# Patient Record
Sex: Female | Born: 1975 | Race: White | Hispanic: No | Marital: Married | Smoking: Never smoker
Health system: Southern US, Community
[De-identification: ages and names within clinical notes are randomized; demographics above are authoritative.]

## PROBLEM LIST (undated history)

## (undated) DIAGNOSIS — F319 Bipolar disorder, unspecified: Secondary | ICD-10-CM

## (undated) DIAGNOSIS — E7800 Pure hypercholesterolemia, unspecified: Secondary | ICD-10-CM

## (undated) DIAGNOSIS — F419 Anxiety disorder, unspecified: Secondary | ICD-10-CM

---

## 2016-10-19 ENCOUNTER — Other Ambulatory Visit: Payer: Self-pay | Admitting: Surgical Oncology

## 2016-10-19 DIAGNOSIS — K219 Gastro-esophageal reflux disease without esophagitis: Secondary | ICD-10-CM

## 2016-10-29 ENCOUNTER — Ambulatory Visit
Admission: RE | Admit: 2016-10-29 | Discharge: 2016-10-29 | Disposition: A | Discharge status: Home / Self Care | Payer: Managed Care, Other (non HMO) | Source: Ambulatory Visit | Attending: Surgical Oncology | Admitting: Surgical Oncology

## 2016-10-29 DIAGNOSIS — K219 Gastro-esophageal reflux disease without esophagitis: Secondary | ICD-10-CM

## 2017-04-28 ENCOUNTER — Emergency Department (HOSPITAL_BASED_OUTPATIENT_CLINIC_OR_DEPARTMENT_OTHER)
Admission: EM | Admit: 2017-04-28 | Discharge: 2017-04-29 | Disposition: A | Discharge status: Home / Self Care | Payer: Managed Care, Other (non HMO) | Attending: Emergency Medicine | Admitting: Emergency Medicine

## 2017-04-28 ENCOUNTER — Encounter (HOSPITAL_BASED_OUTPATIENT_CLINIC_OR_DEPARTMENT_OTHER): Payer: Self-pay

## 2017-04-28 ENCOUNTER — Emergency Department (HOSPITAL_BASED_OUTPATIENT_CLINIC_OR_DEPARTMENT_OTHER): Payer: Managed Care, Other (non HMO)

## 2017-04-28 DIAGNOSIS — R109 Unspecified abdominal pain: Secondary | ICD-10-CM | POA: Insufficient documentation

## 2017-04-28 DIAGNOSIS — Z79899 Other long term (current) drug therapy: Secondary | ICD-10-CM | POA: Insufficient documentation

## 2017-04-28 HISTORY — DX: Anxiety disorder, unspecified: F41.9

## 2017-04-28 HISTORY — DX: Pure hypercholesterolemia, unspecified: E78.00

## 2017-04-28 HISTORY — DX: Bipolar disorder, unspecified: F31.9

## 2017-04-28 LAB — COMPREHENSIVE METABOLIC PANEL
ALBUMIN: 4.1 g/dL (ref 3.5–5.0)
ALK PHOS: 67 U/L (ref 38–126)
ALT: 22 U/L (ref 14–54)
ANION GAP: 9 (ref 5–15)
AST: 23 U/L (ref 15–41)
BUN: 26 mg/dL — ABNORMAL HIGH (ref 6–20)
CO2: 24 mmol/L (ref 22–32)
Calcium: 9.7 mg/dL (ref 8.9–10.3)
Chloride: 104 mmol/L (ref 101–111)
Creatinine, Ser: 0.94 mg/dL (ref 0.44–1.00)
GFR calc non Af Amer: >60 mL/min (ref >60)
Glucose, Bld: 117 mg/dL — ABNORMAL HIGH (ref 65–99)
POTASSIUM: 4 mmol/L (ref 3.5–5.1)
SODIUM: 137 mmol/L (ref 135–145)
TOTAL PROTEIN: 7.5 g/dL (ref 6.5–8.1)
Total Bilirubin: 0.2 mg/dL — ABNORMAL LOW (ref 0.3–1.2)

## 2017-04-28 LAB — CBC WITH DIFFERENTIAL/PLATELET
BASOS PCT: 0 %
Basophils Absolute: 0 10*3/uL (ref 0.0–0.1)
EOS ABS: 0.4 10*3/uL (ref 0.0–0.7)
EOS PCT: 5 %
HCT: 37.6 % (ref 36.0–46.0)
Hemoglobin: 12.8 g/dL (ref 12.0–15.0)
LYMPHS ABS: 1.9 10*3/uL (ref 0.7–4.0)
Lymphocytes Relative: 24 %
MCH: 31.5 pg (ref 26.0–34.0)
MCHC: 34 g/dL (ref 30.0–36.0)
MCV: 92.6 fL (ref 78.0–100.0)
MONOS PCT: 10 %
Monocytes Absolute: 0.8 10*3/uL (ref 0.1–1.0)
NEUTROS PCT: 61 %
Neutro Abs: 5 10*3/uL (ref 1.7–7.7)
PLATELETS: 180 10*3/uL (ref 150–400)
RBC: 4.06 MIL/uL (ref 3.87–5.11)
RDW: 12.7 % (ref 11.5–15.5)
WBC: 8.1 10*3/uL (ref 4.0–10.5)

## 2017-04-28 LAB — PREGNANCY, URINE: PREG TEST UR: NEGATIVE

## 2017-04-28 LAB — LIPASE, BLOOD: LIPASE: 48 U/L (ref 11–51)

## 2017-04-28 MED ORDER — ONDANSETRON HCL 4 MG/2ML IJ SOLN
4.0000 mg | Freq: Once | INTRAMUSCULAR | Status: AC
  Administered 2017-04-28: 4 mg via INTRAVENOUS
  Filled 2017-04-28: qty 2

## 2017-04-28 MED ORDER — MORPHINE SULFATE (PF) 4 MG/ML IV SOLN
4.0000 mg | Freq: Once | INTRAVENOUS | Status: AC
  Administered 2017-04-28: 4 mg via INTRAVENOUS
  Filled 2017-04-28: qty 1

## 2017-04-28 NOTE — ED Triage Notes (Signed)
RLQ pain, n/v started approx 8pm-NAD-steady gait

## 2017-04-29 LAB — URINALYSIS, ROUTINE W REFLEX MICROSCOPIC
BILIRUBIN URINE: NEGATIVE
Glucose, UA: NEGATIVE mg/dL
Ketones, ur: NEGATIVE mg/dL
Leukocytes, UA: NEGATIVE
NITRITE: NEGATIVE
PROTEIN: NEGATIVE mg/dL
pH: 6 (ref 5.0–8.0)

## 2017-04-29 LAB — URINALYSIS, MICROSCOPIC (REFLEX)

## 2017-04-29 MED ORDER — ONDANSETRON 8 MG PO TBDP: 8.0000 mg | ORAL_TABLET | Freq: Three times a day (TID) | ORAL | 0 refills | Status: AC | PRN

## 2017-04-29 NOTE — ED Provider Notes (Signed)
[facility]   CSN: [id number] Arrival date & time: 04/28/17  2225     History   Chief Complaint Chief Complaint  Patient presents with  . Abdominal Pain    HPI Amy Moran is a 41 y.o. female.  HPI Patient presents to the emergency department with acute onset nausea vomiting or right sided abdominal pain with radiation from her right back towards her right abdomen.  No prior history of gallstones.  No prior history kidney stones.  She does report that she had some urinary frequency without dysuria.  No fevers or chills.  Denies blood in her vomit.  Denies diarrhea.  Patient was in her normal state of health prior to this.  Symptoms are moderate in severity.  Symptoms improving at this time   Past Medical History:  Diagnosis Date  . Anxiety   . Bipolar disorder (HCC)   . High cholesterol     There are no active problems to display for this patient.   History reviewed. No pertinent surgical history.  OB History    No data available       Home Medications    Prior to Admission medications   Medication Sig Start Date End Date Taking? Authorizing Provider  LamoTRIgine (LAMICTAL PO) Take by mouth.   Yes [provider]  PARoxetine HCl (PAXIL PO) Take by mouth.   Yes [provider]  QUEtiapine Fumarate (SEROQUEL PO) Take by mouth.   Yes [provider]  Rosuvastatin Calcium (CRESTOR PO) Take by mouth.   Yes [provider]  ondansetron (ZOFRAN ODT) 8 MG disintegrating tablet Take 1 tablet (8 mg total) by mouth every 8 (eight) hours as needed for nausea or vomiting. 04/29/17   Azalia Bilis, MD    Family History No family history on file.  Social History Social History  Substance Use Topics  . Smoking status: Never Smoker  . Smokeless tobacco: Never Used  . Alcohol use No     Allergies   Cymbalta [duloxetine hcl]   Review of Systems Review of Systems  All other systems reviewed and are  negative.    Physical Exam Updated Vital Signs BP 105/79 (BP Location: Left Arm)   Pulse 85   Temp 97.6 F (36.4 C) (Oral)   Resp 18   Ht  (1.702 m)   Wt 122.9 kg (271 lb)   LMP 04/28/2017 Comment: (-) u preg//a.c.  SpO2 98%   BMI 42.44 kg/m   Physical Exam  Constitutional: She is oriented to person, place, and time. She appears well-developed and well-nourished. No distress.  HENT:  Head: Normocephalic and atraumatic.  Eyes: EOM are normal.  Neck: Normal range of motion.  Cardiovascular: Normal rate and regular rhythm.   Pulmonary/Chest: Effort normal and breath sounds normal.  Abdominal: Soft. She exhibits no distension. There is no tenderness.  Musculoskeletal: Normal range of motion.  Neurological: She is alert and oriented to person, place, and time.  Skin: Skin is warm and dry.  Psychiatric: She has a normal mood and affect. Judgment normal.  Nursing note and vitals reviewed.    ED Treatments / Results  Labs (all labs ordered are listed, but only abnormal results are displayed) Labs Reviewed  URINALYSIS, ROUTINE W REFLEX MICROSCOPIC - Abnormal; Notable for the following:       Result Value   APPearance HAZY (*)    Specific Gravity, Urine >1.030 (*)    Hgb urine dipstick LARGE (*)    All  other components within normal limits  COMPREHENSIVE METABOLIC PANEL - Abnormal; Notable for the following:    Glucose, Bld 117 (*)    BUN 26 (*)    Total Bilirubin 0.2 (*)    All other components within normal limits  URINALYSIS, MICROSCOPIC (REFLEX) - Abnormal; Notable for the following:    Bacteria, UA FEW (*)    Squamous Epithelial / LPF 0-5 (*)    All other components within normal limits  PREGNANCY, URINE  CBC WITH DIFFERENTIAL/PLATELET  LIPASE, BLOOD    EKG  EKG Interpretation None       Radiology Ct Renal Stone Study  Result Date: 04/29/2017 CLINICAL DATA:  Right lower quadrant and right lateral abdominal pain 4 hours with nausea and vomiting as  well as hematuria. EXAM: CT ABDOMEN AND PELVIS WITHOUT CONTRAST TECHNIQUE: Multidetector CT imaging of the abdomen and pelvis was performed following the standard protocol without IV contrast. COMPARISON:  None. FINDINGS: Lower chest: No acute abnormality. Hepatobiliary: Within normal. Pancreas: Normal. Spleen: Normal. Adrenals/Urinary Tract: Adrenal glands are normal. Kidneys are normal in size faint nonobstructing stone over the mid pole collecting system of the right kidney. No left renal stones. No significant hydronephrosis. Ureters are within normal without evidence of stones. Bladder is normal. Stomach/Bowel: Stomach and small bowel are normal. Appendix is normal. Colon is normal. Vascular/Lymphatic: Within normal. Reproductive: Within normal. Other: No free fluid or focal inflammatory change. Musculoskeletal: Within normal. IMPRESSION: No acute findings in the abdomen/pelvis. Possible punctate faint stone over the mid pole collecting system of the right kidney. No ureteral stones or obstruction. Electronically Signed   By: Elberta Fortis M.D.   On: 04/29/2017 00:28    Procedures Procedures (including critical care time)  Medications Ordered in ED Medications  morphine 4 MG/ML injection 4 mg (4 mg Intravenous Given 04/28/17 2350)  ondansetron (ZOFRAN) injection 4 mg (4 mg Intravenous Given 04/28/17 2350)     Initial Impression / Assessment and Plan / ED Course  I have reviewed the triage vital signs and the nursing notes.  Pertinent labs & imaging results that were available during my care of the patient were reviewed by me and considered in my medical decision making (see chart for details).   no significant tenderness on examination.  CT stone study demonstrates no obvious evidence of ureteral stone at this time.  She does have some hematuria noted without signs of infection.  I suspect this is a recently passed right ureteral stone.  Patient feels much better at this time.  No other  pathology noted on CT imaging.  Discharge home in good condition.  Patient understands to return to the ER for new or worsening symptoms  Final Clinical Impressions(s) / ED Diagnoses   Final diagnoses:  Acute abdominal pain    New Prescriptions Discharge Medication List as of 04/29/2017 12:45 AM    START taking these medications   Details  ondansetron (ZOFRAN ODT) 8 MG disintegrating tablet Take 1 tablet (8 mg total) by mouth every 8 (eight) hours as needed for nausea or vomiting., Starting Thu 04/29/2017, Catalina Gravel, MD 04/29/17 [phone]

## 2018-05-27 ENCOUNTER — Other Ambulatory Visit: Payer: Self-pay | Admitting: Physician Assistant

## 2018-05-27 DIAGNOSIS — Z1231 Encounter for screening mammogram for malignant neoplasm of breast: Secondary | ICD-10-CM

## 2018-05-31 ENCOUNTER — Ambulatory Visit
Admission: RE | Admit: 2018-05-31 | Discharge: 2018-05-31 | Disposition: A | Discharge status: Home / Self Care | Payer: Managed Care, Other (non HMO) | Source: Ambulatory Visit

## 2018-05-31 DIAGNOSIS — Z1231 Encounter for screening mammogram for malignant neoplasm of breast: Secondary | ICD-10-CM

## 2018-11-03 ENCOUNTER — Other Ambulatory Visit: Payer: Self-pay

## 2018-11-03 ENCOUNTER — Encounter (HOSPITAL_BASED_OUTPATIENT_CLINIC_OR_DEPARTMENT_OTHER): Payer: Self-pay

## 2018-11-03 ENCOUNTER — Emergency Department (HOSPITAL_BASED_OUTPATIENT_CLINIC_OR_DEPARTMENT_OTHER): Payer: Managed Care, Other (non HMO)

## 2018-11-03 ENCOUNTER — Emergency Department (HOSPITAL_BASED_OUTPATIENT_CLINIC_OR_DEPARTMENT_OTHER)
Admission: EM | Admit: 2018-11-03 | Discharge: 2018-11-03 | Disposition: A | Discharge status: Home / Self Care | Payer: Managed Care, Other (non HMO) | Attending: Emergency Medicine | Admitting: Emergency Medicine

## 2018-11-03 DIAGNOSIS — N132 Hydronephrosis with renal and ureteral calculous obstruction: Secondary | ICD-10-CM | POA: Insufficient documentation

## 2018-11-03 DIAGNOSIS — Z79899 Other long term (current) drug therapy: Secondary | ICD-10-CM | POA: Insufficient documentation

## 2018-11-03 DIAGNOSIS — N201 Calculus of ureter: Secondary | ICD-10-CM

## 2018-11-03 DIAGNOSIS — R1031 Right lower quadrant pain: Secondary | ICD-10-CM | POA: Diagnosis present

## 2018-11-03 LAB — URINALYSIS, MICROSCOPIC (REFLEX): RBC / HPF: >50 RBC/hpf (ref 0–5)

## 2018-11-03 LAB — URINALYSIS, ROUTINE W REFLEX MICROSCOPIC
Bilirubin Urine: NEGATIVE
Glucose, UA: NEGATIVE mg/dL
Ketones, ur: NEGATIVE mg/dL
Nitrite: NEGATIVE
Protein, ur: 30 mg/dL — AB
Specific Gravity, Urine: >1.03 — ABNORMAL HIGH (ref 1.005–1.030)
pH: 6 (ref 5.0–8.0)

## 2018-11-03 LAB — COMPREHENSIVE METABOLIC PANEL
ALT: 12 U/L (ref 0–44)
AST: 18 U/L (ref 15–41)
Albumin: 4.1 g/dL (ref 3.5–5.0)
Alkaline Phosphatase: 61 U/L (ref 38–126)
Anion gap: 10 (ref 5–15)
BUN: 22 mg/dL — ABNORMAL HIGH (ref 6–20)
CO2: 23 mmol/L (ref 22–32)
Calcium: 9.8 mg/dL (ref 8.9–10.3)
Chloride: 102 mmol/L (ref 98–111)
Creatinine, Ser: 1.33 mg/dL — ABNORMAL HIGH (ref 0.44–1.00)
GFR calc Af Amer: 56 mL/min — ABNORMAL LOW (ref >60)
GFR calc non Af Amer: 48 mL/min — ABNORMAL LOW (ref >60)
Glucose, Bld: 101 mg/dL — ABNORMAL HIGH (ref 70–99)
Potassium: 4.1 mmol/L (ref 3.5–5.1)
Sodium: 135 mmol/L (ref 135–145)
Total Bilirubin: 0.1 mg/dL — ABNORMAL LOW (ref 0.3–1.2)
Total Protein: 7.7 g/dL (ref 6.5–8.1)

## 2018-11-03 LAB — CBC WITH DIFFERENTIAL/PLATELET
Abs Immature Granulocytes: 0.05 10*3/uL (ref 0.00–0.07)
Basophils Absolute: 0 10*3/uL (ref 0.0–0.1)
Basophils Relative: 0 %
Eosinophils Absolute: 0 10*3/uL (ref 0.0–0.5)
Eosinophils Relative: 0 %
HCT: 42.8 % (ref 36.0–46.0)
Hemoglobin: 13.9 g/dL (ref 12.0–15.0)
Immature Granulocytes: 0 %
Lymphocytes Relative: 15 %
Lymphs Abs: 2 10*3/uL (ref 0.7–4.0)
MCH: 30.7 pg (ref 26.0–34.0)
MCHC: 32.5 g/dL (ref 30.0–36.0)
MCV: 94.5 fL (ref 80.0–100.0)
Monocytes Absolute: 1.2 10*3/uL — ABNORMAL HIGH (ref 0.1–1.0)
Monocytes Relative: 10 %
Neutro Abs: 9.5 10*3/uL — ABNORMAL HIGH (ref 1.7–7.7)
Neutrophils Relative %: 75 %
Platelets: 217 10*3/uL (ref 150–400)
RBC: 4.53 MIL/uL (ref 3.87–5.11)
RDW: 11.9 % (ref 11.5–15.5)
WBC: 12.7 10*3/uL — ABNORMAL HIGH (ref 4.0–10.5)
nRBC: 0 % (ref 0.0–0.2)

## 2018-11-03 LAB — PREGNANCY, URINE: Preg Test, Ur: NEGATIVE

## 2018-11-03 LAB — LIPASE, BLOOD: Lipase: 38 U/L (ref 11–51)

## 2018-11-03 MED ORDER — KETOROLAC TROMETHAMINE 15 MG/ML IJ SOLN
30.0000 mg | Freq: Once | INTRAMUSCULAR | Status: AC
  Administered 2018-11-03: 30 mg via INTRAVENOUS
  Filled 2018-11-03: qty 2

## 2018-11-03 MED ORDER — ONDANSETRON HCL 4 MG PO TABS: 4.0000 mg | ORAL_TABLET | Freq: Three times a day (TID) | ORAL | 0 refills | Status: DC | PRN

## 2018-11-03 MED ORDER — HYDROCODONE-ACETAMINOPHEN 5-325 MG PO TABS: 1.0000 | ORAL_TABLET | Freq: Three times a day (TID) | ORAL | 0 refills | Status: AC | PRN

## 2018-11-03 MED ORDER — SODIUM CHLORIDE 0.9 % IV BOLUS
500.0000 mL | Freq: Once | INTRAVENOUS | Status: AC
  Administered 2018-11-03: 500 mL via INTRAVENOUS

## 2018-11-03 MED ORDER — ONDANSETRON HCL 4 MG PO TABS: 4.0000 mg | ORAL_TABLET | Freq: Three times a day (TID) | ORAL | 0 refills | Status: AC | PRN

## 2018-11-03 MED ORDER — HYDROCODONE-ACETAMINOPHEN 5-325 MG PO TABS
1.0000 | ORAL_TABLET | Freq: Once | ORAL | Status: AC
  Administered 2018-11-03: 21:00:00 1 via ORAL
  Filled 2018-11-03: qty 1

## 2018-11-03 MED ORDER — TAMSULOSIN HCL 0.4 MG PO CAPS
0.4000 mg | ORAL_CAPSULE | Freq: Once | ORAL | Status: AC
  Administered 2018-11-03: 0.4 mg via ORAL
  Filled 2018-11-03: qty 1

## 2018-11-03 MED ORDER — TAMSULOSIN HCL 0.4 MG PO CAPS: 0.4000 mg | ORAL_CAPSULE | Freq: Every day | ORAL | 0 refills | Status: AC

## 2018-11-03 MED ORDER — ONDANSETRON HCL 4 MG/2ML IJ SOLN
4.0000 mg | Freq: Once | INTRAMUSCULAR | Status: AC
  Administered 2018-11-03: 19:00:00 4 mg via INTRAVENOUS
  Filled 2018-11-03: qty 2

## 2018-11-03 NOTE — ED Notes (Signed)
Patient to CT in stable condition.

## 2018-11-03 NOTE — ED Notes (Signed)
Initial contact with patient. PIV placed. Blood specimen walked to lab. Call light in reach.

## 2018-11-03 NOTE — ED Triage Notes (Signed)
Pt c/o RLQ pain that started today, radiates to her back, she was sent from an UC in [place], denies nausea, vomiting, diarrhea, pt is c/o urinary frequency for a week as well, unknown if she had a fever, took 800mg  ibuprofen at 1530

## 2018-11-03 NOTE — ED Notes (Signed)
PT states understanding of care given, follow up care, and medication prescribed. PT ambulated from ED to car with a steady gait. 

## 2018-11-03 NOTE — ED Notes (Signed)
Patient ambulatory to BR with steady gait.

## 2018-11-03 NOTE — Discharge Instructions (Addendum)
You were noted to have a ureteral stone on your CT scan.  A culture was sent of your urine today to determine if there is any bacterial growth. If the results of the culture are positive and you require an antibiotic or a change of your prescribed antibiotic you will be contacted by the hospital. If the results are negative you will not be contacted.  You will be given a prescription for Flomax, pain medication, and nausea medication.  You should not drive, work, or operate machinery while taking the pain medication as it can make you very drowsy.  You may take 600mg  Ibuprofen  every 6 hours and take one Norco every 8 hours for breakthrough pain. Make sure you stay well hydrated. You will need to follow-up with urology for reevaluation and for further treatment of your kidney stone.  You will need to return to the emergency department for any fevers, persistent pain, persistent vomiting, inability to urinate, or any new or worsening symptoms.

## 2018-11-03 NOTE — ED Provider Notes (Signed)
[facility] EMERGENCY DEPARTMENT Provider Note   CSN: [id number] Arrival date & time: 11/03/18  1830    History   Chief Complaint Chief Complaint  Patient presents with   Abdominal Pain    HPI Amy Moran is a 43 y.o. female.     HPI   Patient is a 43 year old female with history of anxiety, bipolar disorder, high cholesterol, who presents to the emergency department today for evaluation of abdominal pain that began this morning.  States pain located to the right lower quadrant.  She has had intermittent radiation of pain across the left side of the stomach and to the right back.  Currently rates her pain 7/10.  Feels that the stabbing pain.  It has been waxing and waning since onset.  She tried to take 800 mg ibuprofen earlier today without relief of symptoms.  She reports associated nausea but denies any vomiting, diarrhea or constipation.  States she has had urinary frequency for the last 4 days.  No hematuria or dysuria.  States she has a history of nephrolithiasis however states that her symptoms today are not as severe as when she had a prior kidney stone.  States she was seen in urgent care prior to arrival and was sent here for further evaluation.  Past Medical History:  Diagnosis Date   Anxiety    Bipolar disorder (HCC)    High cholesterol     There are no active problems to display for this patient.   History reviewed. No pertinent surgical history.   OB History   No obstetric history on file.      Home Medications    Prior to Admission medications   Medication Sig Start Date End Date Taking? Authorizing Provider  HYDROcodone-acetaminophen (NORCO/VICODIN) 5-325 MG tablet Take 1 tablet by mouth every 8 (eight) hours as needed. 11/03/18   Rama Sorci S, PA-C  LamoTRIgine (LAMICTAL PO) Take by mouth.    [provider]  ondansetron (ZOFRAN ODT) 8 MG disintegrating tablet Take 1 tablet (8 mg total) by mouth every 8 (eight) hours as  needed for nausea or vomiting. 04/29/17   Azalia Bilis, MD  ondansetron (ZOFRAN) 4 MG tablet Take 1 tablet (4 mg total) by mouth every 8 (eight) hours as needed for nausea or vomiting. 11/03/18   Goldie Tregoning S, PA-C  PARoxetine HCl (PAXIL PO) Take by mouth.    [provider]  QUEtiapine Fumarate (SEROQUEL PO) Take by mouth.    [provider]  Rosuvastatin Calcium (CRESTOR PO) Take by mouth.    [provider]  tamsulosin (FLOMAX) 0.4 MG CAPS capsule Take 1 capsule (0.4 mg total) by mouth daily. 11/03/18   Naziah Weckerly S, PA-C    Family History Family History  Problem Relation Age of Onset   Breast cancer Neg Hx     Social History Social History   Tobacco Use   Smoking status: Never Smoker   Smokeless tobacco: Never Used  Substance Use Topics   Alcohol use: No   Drug use: No     Allergies   Cymbalta [duloxetine hcl]   Review of Systems Review of Systems  Constitutional: Negative for chills and fever.  HENT: Negative for ear pain and sore throat.   Eyes: Negative for visual disturbance.  Respiratory: Negative for cough and shortness of breath.   Cardiovascular: Negative for chest pain.  Gastrointestinal: Positive for abdominal pain and nausea. Negative for blood in stool, constipation, diarrhea and vomiting.  Genitourinary: Positive for  flank pain (resolved), frequency and vaginal bleeding (on menses). Negative for dysuria, hematuria, urgency and vaginal discharge.  Musculoskeletal: Negative for back pain.  Skin: Negative for rash.  Neurological: Negative for headaches.  All other systems reviewed and are negative.    Physical Exam Updated Vital Signs BP 113/76 (BP Location: Right Arm)    Pulse 87    Temp 98.4 F (36.9 C) (Oral)    Resp 16    Ht 5\' 7"  (1.702 m)    Wt 113.9 kg    LMP 10/31/2018    SpO2 96%    BMI 39.31 kg/m   Physical Exam Vitals signs and nursing note reviewed.  Constitutional:      General: She is not in acute  distress.    Appearance: She is well-developed. She is not ill-appearing or toxic-appearing.  HENT:     Head: Normocephalic and atraumatic.  Eyes:     Conjunctiva/sclera: Conjunctivae normal.  Neck:     Musculoskeletal: Neck supple.  Cardiovascular:     Rate and Rhythm: Normal rate and regular rhythm.     Heart sounds: Normal heart sounds. No murmur.  Pulmonary:     Effort: Pulmonary effort is normal. No respiratory distress.     Breath sounds: Normal breath sounds. No stridor. No wheezing, rhonchi or rales.  Abdominal:     General: Bowel sounds are normal.     Palpations: Abdomen is soft.     Tenderness: There is abdominal tenderness in the right upper quadrant and right lower quadrant. There is guarding. There is no right CVA tenderness, left CVA tenderness or rebound.  Skin:    General: Skin is warm and dry.  Neurological:     Mental Status: She is alert.      ED Treatments / Results  Labs (all labs ordered are listed, but only abnormal results are displayed) Labs Reviewed  URINALYSIS, ROUTINE W REFLEX MICROSCOPIC - Abnormal; Notable for the following components:      Result Value   Specific Gravity, Urine >1.030 (*)    Hgb urine dipstick LARGE (*)    Protein, ur 30 (*)    Leukocytes,Ua TRACE (*)    All other components within normal limits  CBC WITH DIFFERENTIAL/PLATELET - Abnormal; Notable for the following components:   WBC 12.7 (*)    Neutro Abs 9.5 (*)    Monocytes Absolute 1.2 (*)    All other components within normal limits  COMPREHENSIVE METABOLIC PANEL - Abnormal; Notable for the following components:   Glucose, Bld 101 (*)    BUN 22 (*)    Creatinine, Ser 1.33 (*)    Total Bilirubin 0.1 (*)    GFR calc non Af Amer 48 (*)    GFR calc Af Amer 56 (*)    All other components within normal limits  URINALYSIS, MICROSCOPIC (REFLEX) - Abnormal; Notable for the following components:   Bacteria, UA RARE (*)    All other components within normal limits  URINE  CULTURE  LIPASE, BLOOD  PREGNANCY, URINE    EKG None  Radiology Ct Renal Stone Study  Result Date: 11/03/2018 CLINICAL DATA:  Right lower quadrant pain and tenderness to palpation. Urinary frequency. History of renal stones. EXAM: CT ABDOMEN AND PELVIS WITHOUT CONTRAST TECHNIQUE: Multidetector CT imaging of the abdomen and pelvis was performed following the standard protocol without IV contrast. COMPARISON:  CT 04/29/2017 FINDINGS: Lower chest: Bases are clear. Hepatobiliary: No focal liver abnormality is seen. No gallstones, gallbladder wall thickening, or biliary  dilatation. Pancreas: No ductal dilatation or inflammation. Spleen: Normal in size without focal abnormality. Small splenule anteriorly. Adrenals/Urinary Tract: Normal adrenal glands. Obstructing 7 x 6 mm stone at the right ureterovesicular junction with moderate hydroureteronephrosis. Mild right perinephric edema. No additional nonobstructing stones in either kidney. No left hydronephrosis. Urinary bladder is near completely empty. No bladder wall thickening. Stomach/Bowel: Stomach physiologically distended. No bowel wall thickening, inflammatory change, or obstruction. Normal appendix. Vascular/Lymphatic: Normal caliber abdominal aorta. No enlarged lymph nodes in the abdomen or pelvis. Reproductive: Uterus and bilateral adnexa are unremarkable. Other: Tiny fat containing umbilical hernia. No free air, free fluid, or intra-abdominal fluid collection. Musculoskeletal: Transitional lumbosacral anatomy. There are no acute or suspicious osseous abnormalities. IMPRESSION: Obstructing 7 x 6 mm stone at the right ureterovesicular junction with moderate hydronephrosis. Electronically Signed   By: Narda Rutherford M.D.   On: 11/03/2018 20:16    Procedures Procedures (including critical care time)  Medications Ordered in ED Medications  ketorolac (TORADOL) 15 MG/ML injection 30 mg (30 mg Intravenous Given 11/03/18 1907)  ondansetron (ZOFRAN)  injection 4 mg (4 mg Intravenous Given 11/03/18 1906)  sodium chloride 0.9 % bolus 500 mL (0 mLs Intravenous Stopped 11/03/18 2025)  HYDROcodone-acetaminophen (NORCO/VICODIN) 5-325 MG per tablet 1 tablet (1 tablet Oral Given 11/03/18 2057)  tamsulosin (FLOMAX) capsule 0.4 mg (0.4 mg Oral Given 11/03/18 2057)     Initial Impression / Assessment and Plan / ED Course  I have reviewed the triage vital signs and the nursing notes.  Pertinent labs & imaging results that were available during my care of the patient were reviewed by me and considered in my medical decision making (see chart for details).    Final Clinical Impressions(s) / ED Diagnoses   Final diagnoses:  Ureteral stone   Pt has been diagnosed with a Kidney Stone via CT. there is moderate hydronephrosis.  CBC with mild leukocytosis.  CMP with normal electrolytes.  BUN and creatinine are slightly elevated from baseline.  Liver function normal.  Lipase is negative.  UA with hematuria, proteinuria, trace leukocytes, greater than 50 RBCs, 0-5 WBCs, rare bacteria.  Urine culture sent.  Suspect changes due to current kidney stone.  Doubt infected stone.  Vitals sign stable and the pt does not have irratractable vomiting.  Pain well controlled with 1 dose of Toradol and Norco in the ED.  No vomiting while in the ED.  Able to tolerate p.o.  Pt will be dc home with pain medications & has been advised to follow up with urology or pcp.  Specific return precautions were discussed.  Patient voices understanding of the plan and reasons to return.  All questions answered.  Patient stable for discharge.    ED Discharge Orders         Ordered    HYDROcodone-acetaminophen (NORCO/VICODIN) 5-325 MG tablet  Every 8 hours PRN     11/03/18 2100    tamsulosin (FLOMAX) 0.4 MG CAPS capsule  Daily     11/03/18 2100    ondansetron (ZOFRAN) 4 MG tablet  Every 8 hours PRN,   Status:  Discontinued     11/03/18 2100    ondansetron (ZOFRAN) 4 MG tablet  Every 8 hours  PRN     11/03/18 2107           Karrie Meres, PA-C 11/03/18 2107    Vanetta Mulders, MD 11/03/18 2118

## 2018-11-05 LAB — URINE CULTURE: Culture: 50000 — AB

## 2019-05-07 IMAGING — MG DIGITAL SCREENING BILATERAL MAMMOGRAM WITH TOMO AND CAD
8 series · 8 of 24 positions shown · non-contrast
Comparison: None.

CLINICAL DATA: Screening.

EXAM:
DIGITAL SCREENING BILATERAL MAMMOGRAM WITH TOMO AND CAD

[R MLO synth-2D]
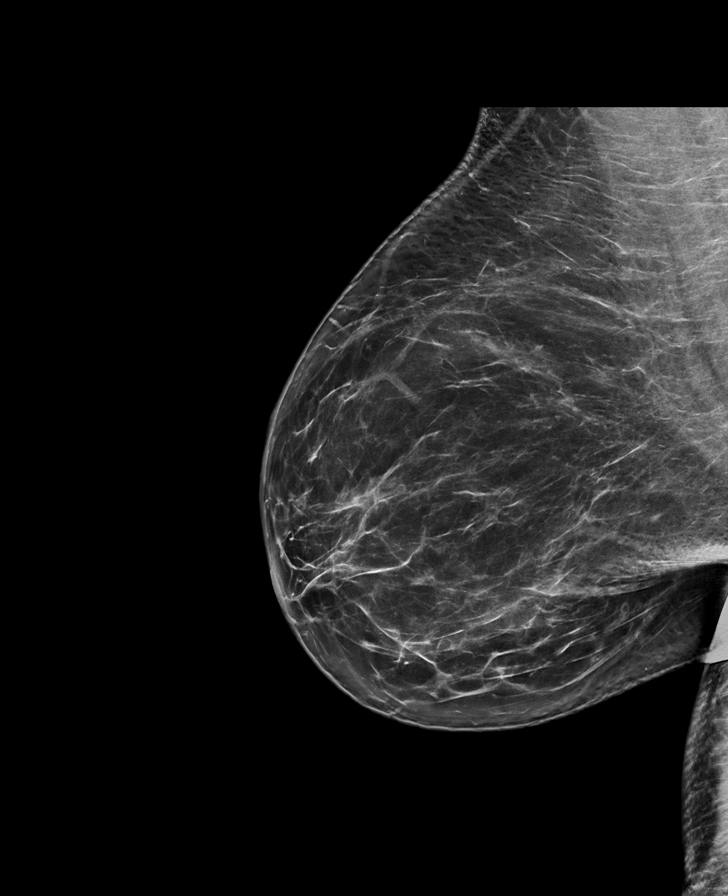

[L CC synth-2D]
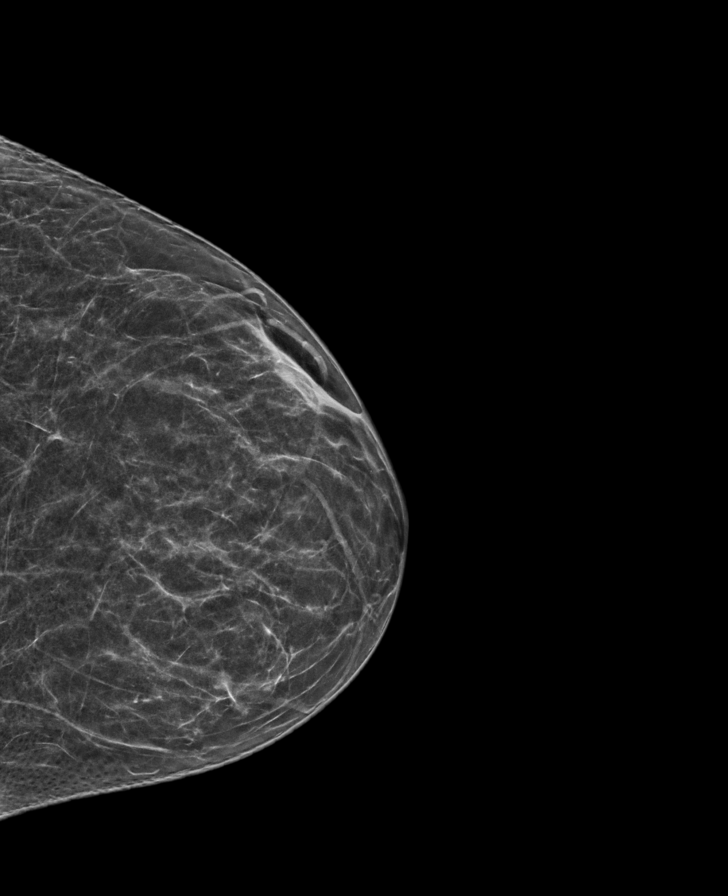

[R CC synth-2D]
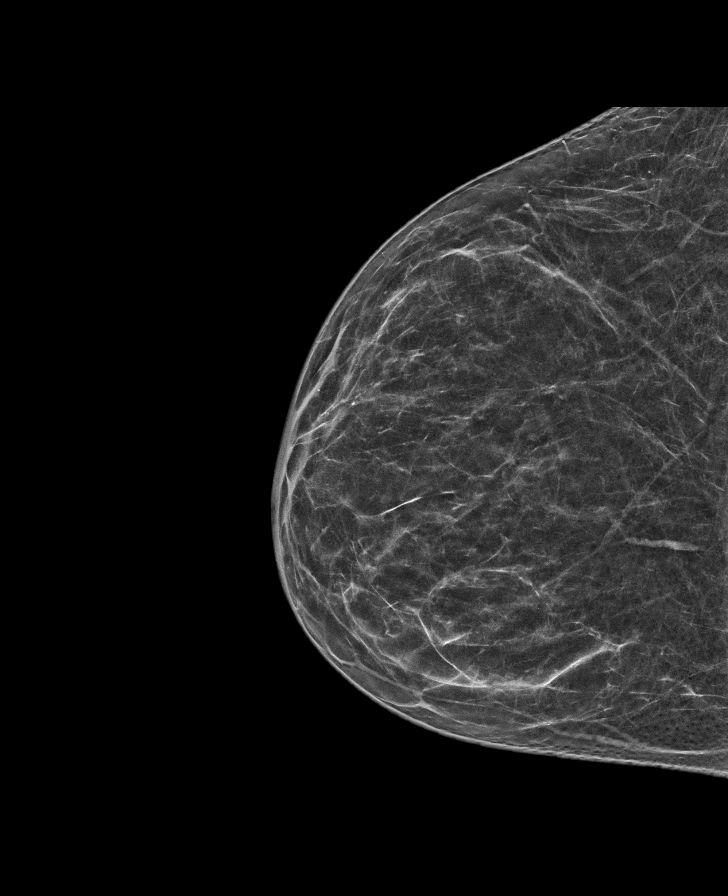

[L MLO synth-2D]
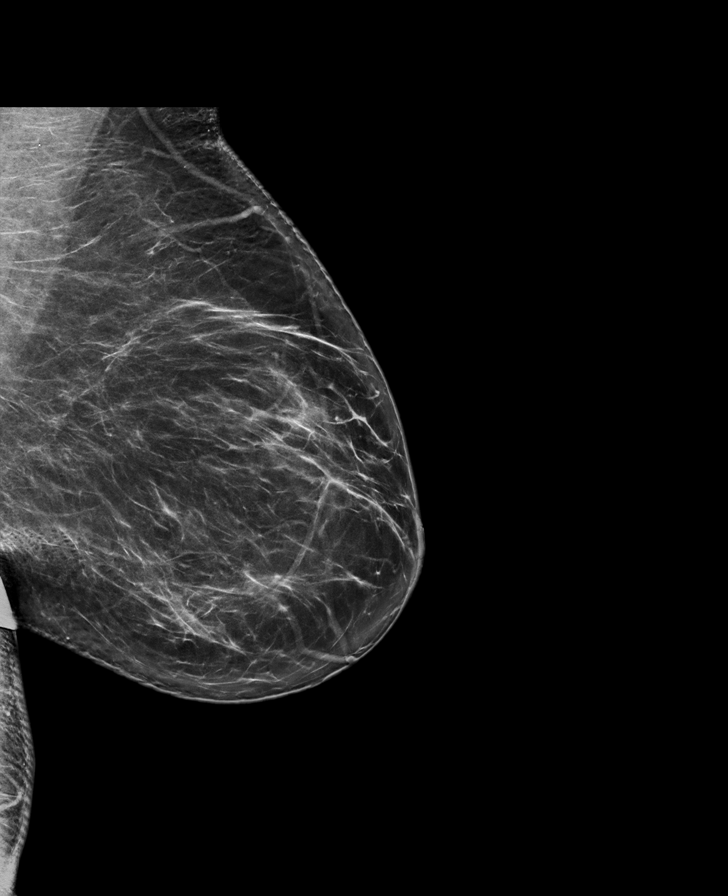

[L MLO tomo · tomo slice 41/81.0]
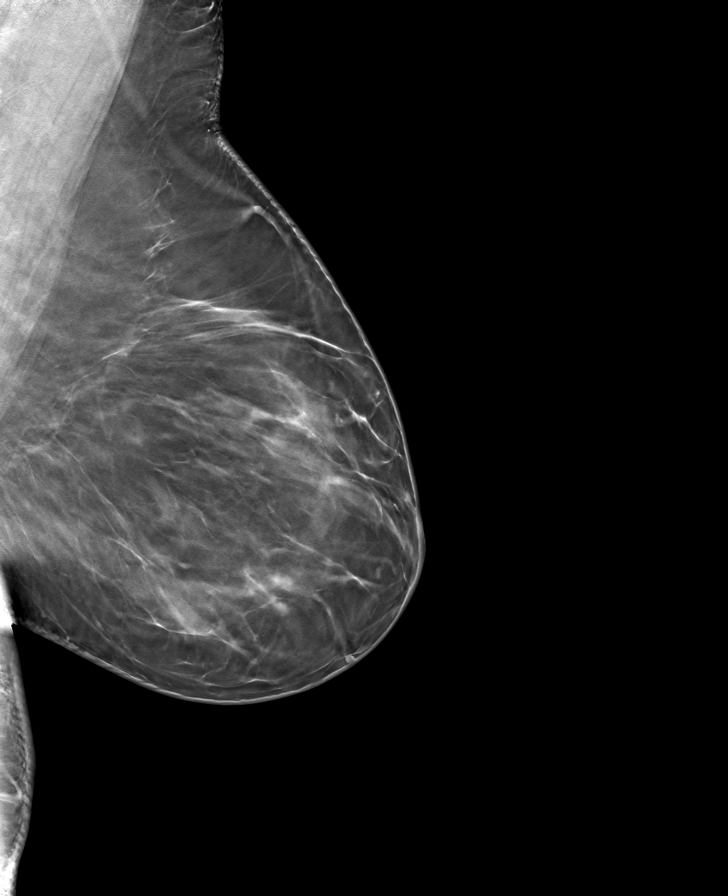

[L CC tomo · tomo slice 29/58.0]
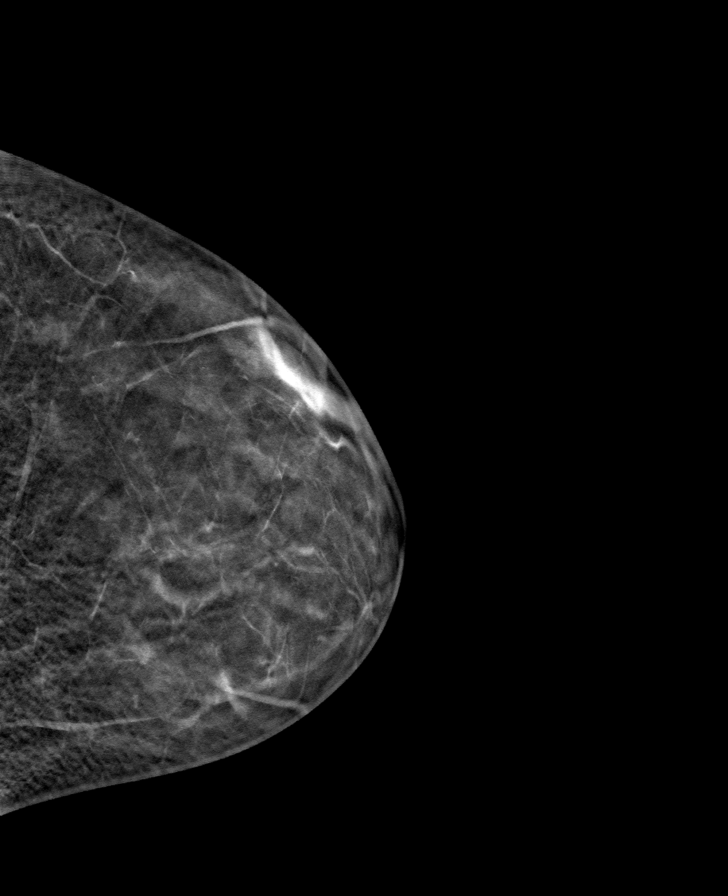

[R CC tomo · tomo slice 31/62.0]
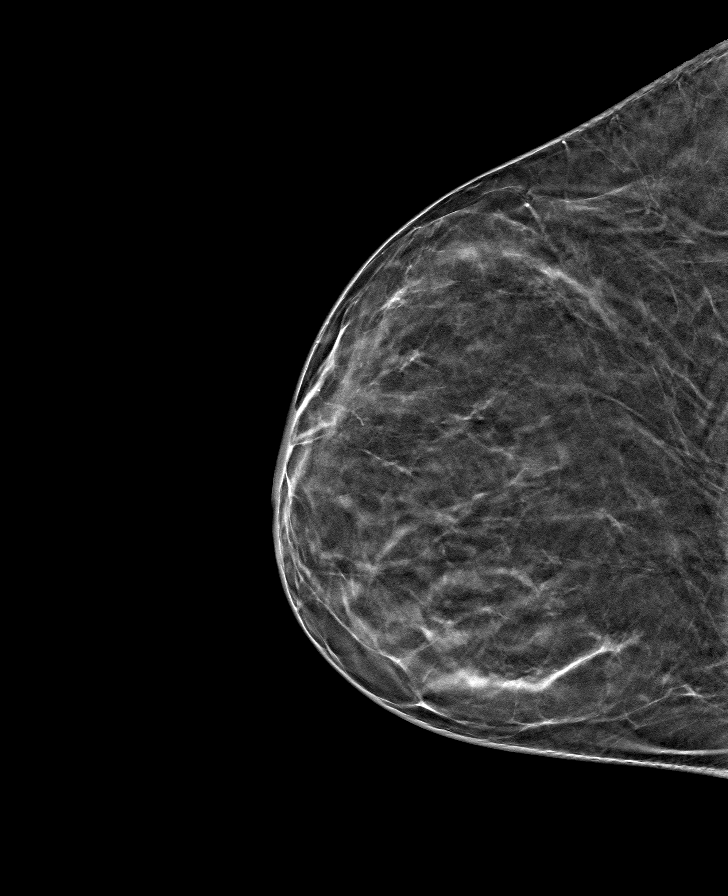

[R MLO tomo · tomo slice 41/81.0]
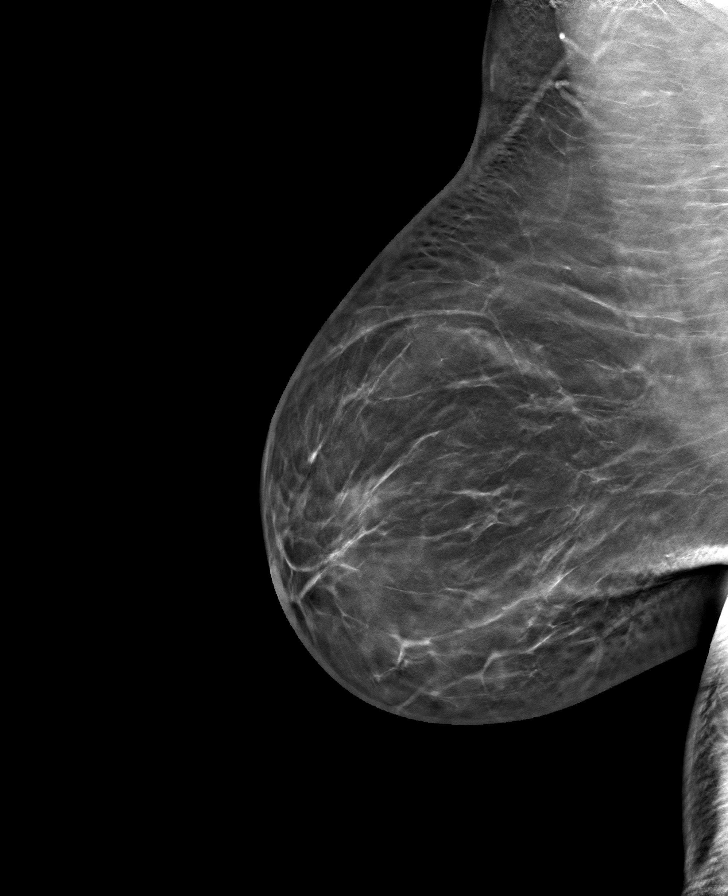

[8 of 24 positions shown; findings below may reference images not displayed]

ACR Breast Density Category b: There are scattered areas of
fibroglandular density.
FINDINGS: There are no findings suspicious for malignancy. Images were
processed with CAD.
IMPRESSION: No mammographic evidence of malignancy. A result letter of this
screening mammogram will be mailed directly to the patient.

RECOMMENDATION:
Screening mammogram in one year. (Code:Y5-G-EJ6)

BI-RADS CATEGORY  1: Negative.

## 2019-05-17 ENCOUNTER — Other Ambulatory Visit: Payer: Self-pay | Admitting: Physician Assistant

## 2019-05-17 DIAGNOSIS — Z1231 Encounter for screening mammogram for malignant neoplasm of breast: Secondary | ICD-10-CM

## 2019-07-05 ENCOUNTER — Ambulatory Visit: Payer: Managed Care, Other (non HMO)

## 2019-08-02 ENCOUNTER — Ambulatory Visit: Payer: Managed Care, Other (non HMO)

## 2019-09-08 ENCOUNTER — Ambulatory Visit: Payer: Self-pay

## 2019-10-10 IMAGING — CT CT RENAL STONE PROTOCOL
2 of 4 series · 16 of 46 positions shown, 18 images · non-contrast
Comparison: CT 04/29/2017

CLINICAL DATA: Right lower quadrant pain and tenderness to
palpation. Urinary frequency. History of renal stones.

EXAM:
CT ABDOMEN AND PELVIS WITHOUT CONTRAST
TECHNIQUE: Multidetector CT imaging of the abdomen and pelvis was performed
following the standard protocol without IV contrast.

[Series 2: axial st · axial · 0.93mm/px · z∈[-722,-208]mm · 13 of 113 slices shown, 15 images]
[im 5/113  soft-tissue]
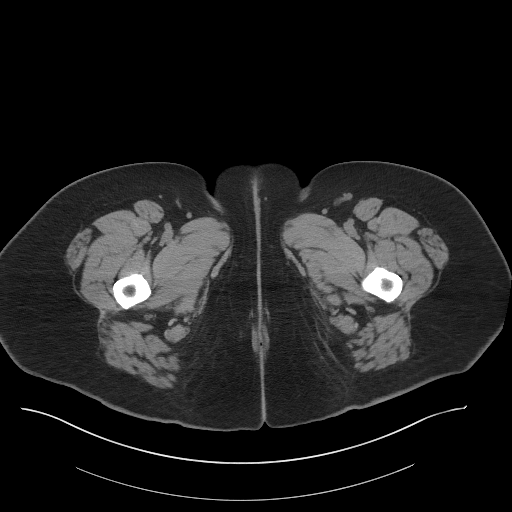
[im 5/113  bone]
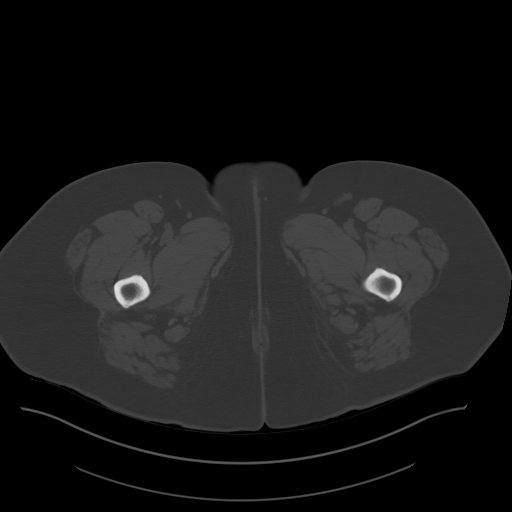
[im 15/113  soft-tissue]
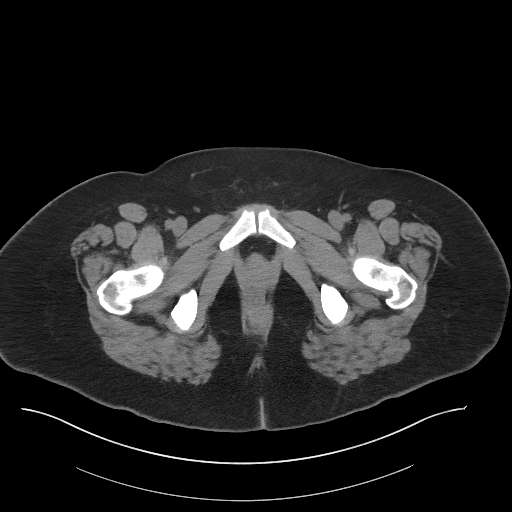
[im 24/113  soft-tissue]
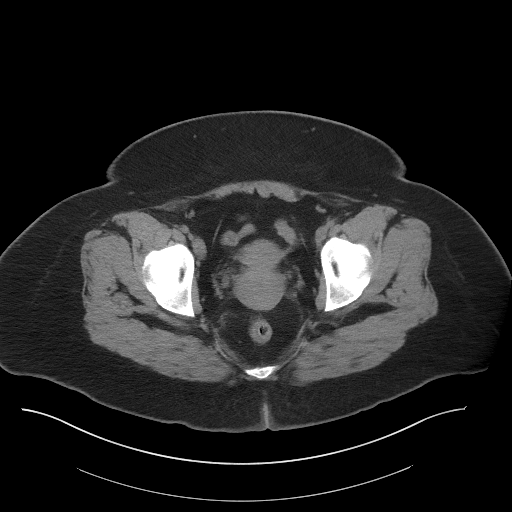
[im 33/113  soft-tissue]
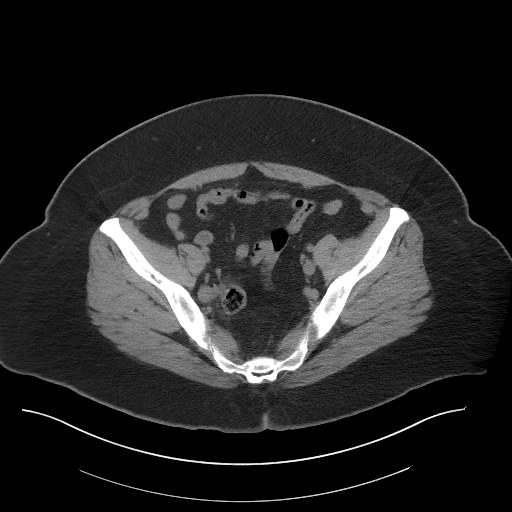
[im 38/113  soft-tissue]
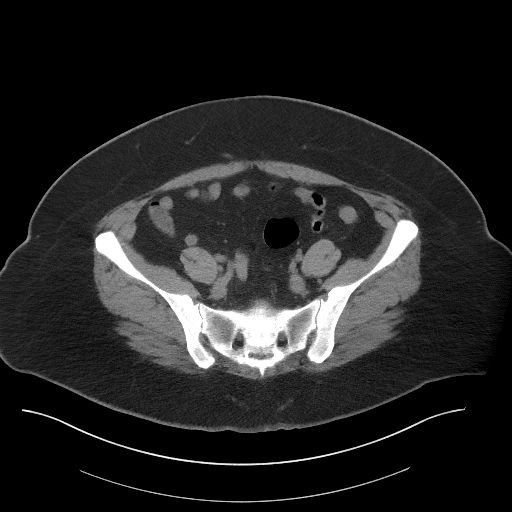
[im 47/113  soft-tissue]
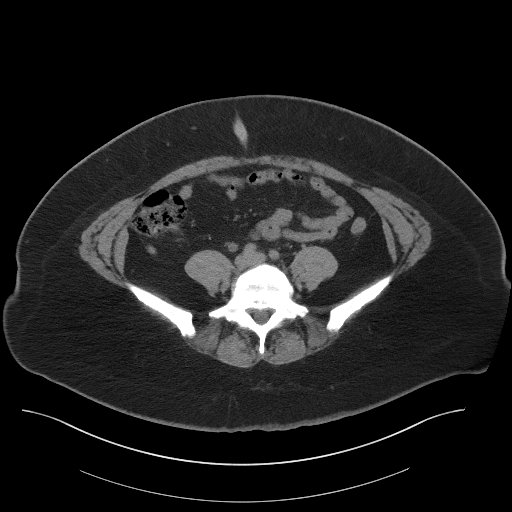
[im 57/113  soft-tissue]
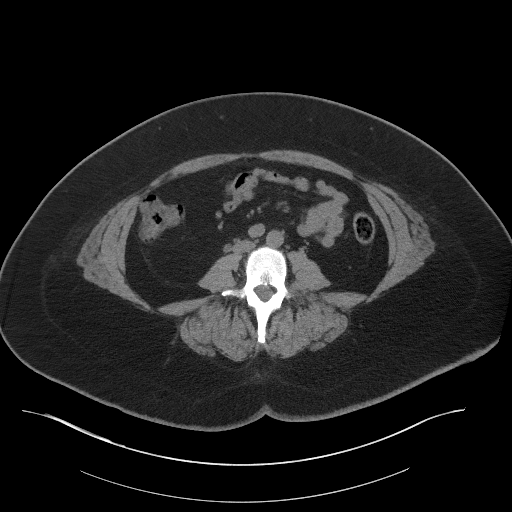
[im 66/113  soft-tissue]
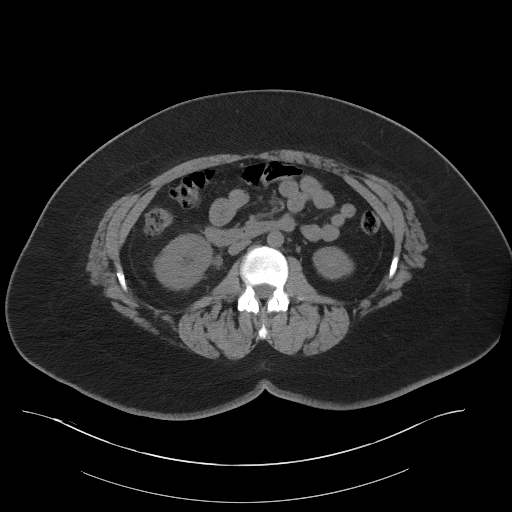
[im 75/113  soft-tissue]
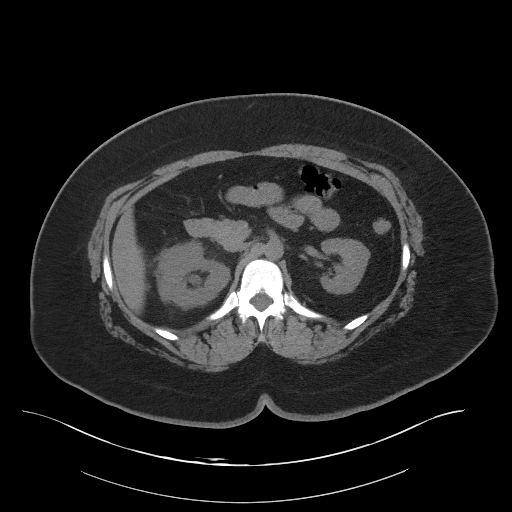
[im 75/113  bone]
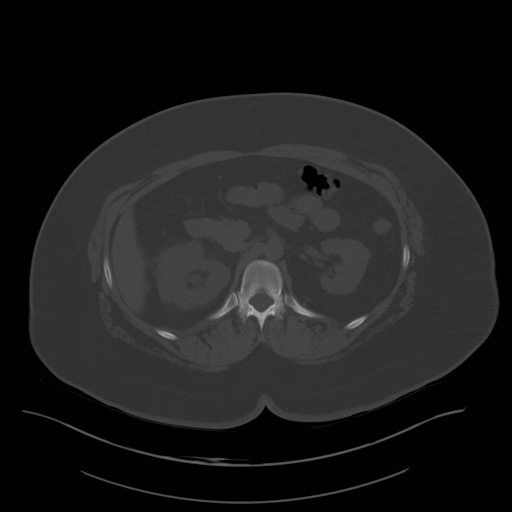
[im 80/113  soft-tissue]
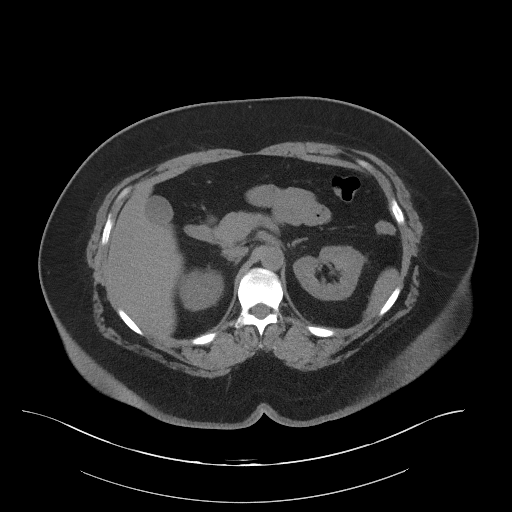
[im 89/113  soft-tissue]
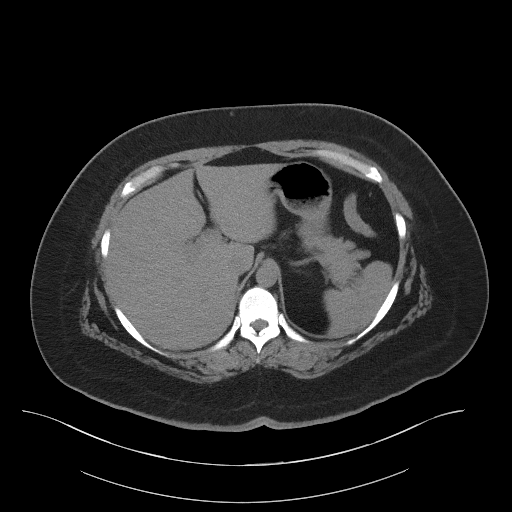
[im 99/113  soft-tissue]
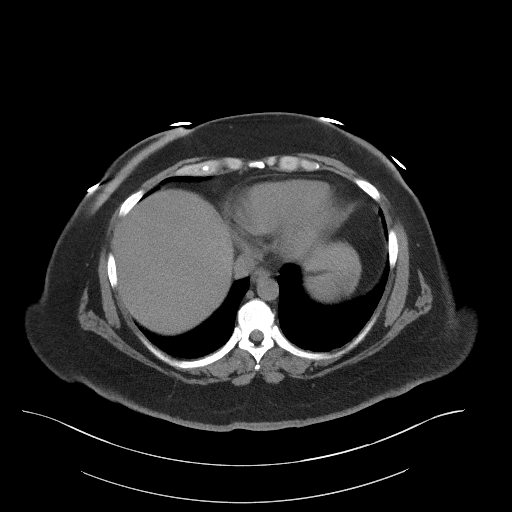
[im 108/113  soft-tissue]
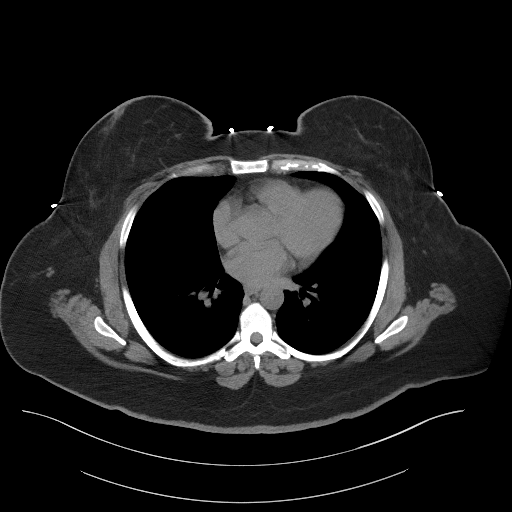

[Series 5: coronal st · coronal · 1.02mm/px · 3 of 101 slices shown]
[im 34/101  soft-tissue]
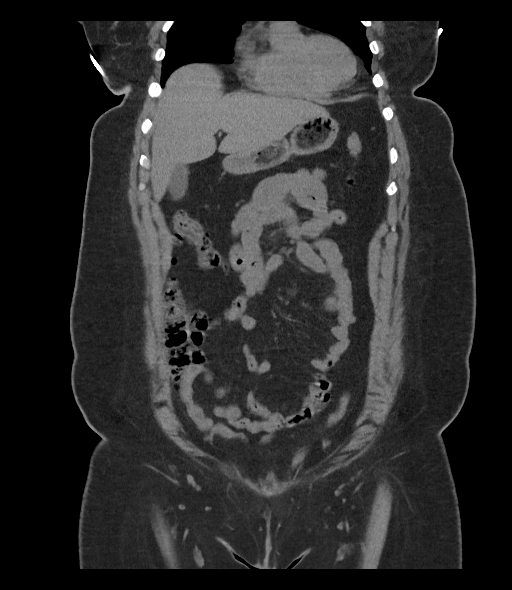
[im 45/101  soft-tissue]
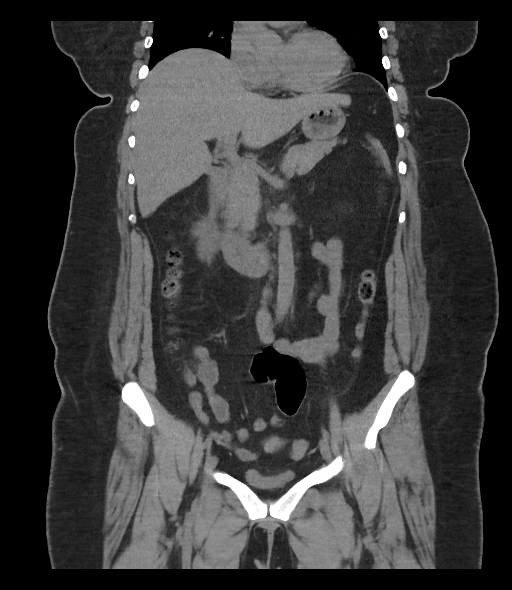
[im 56/101  soft-tissue]
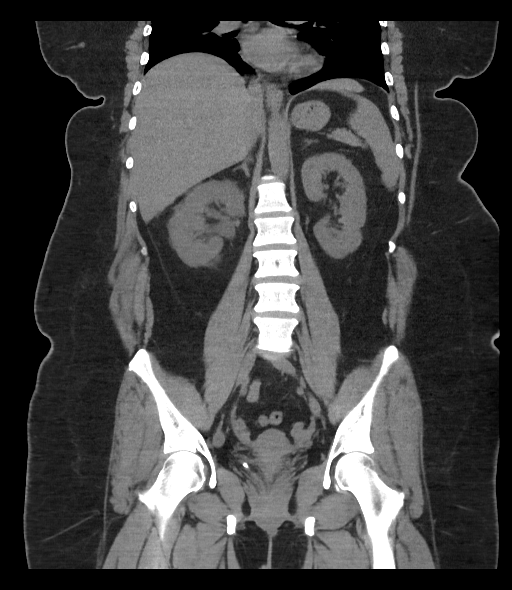

[16 of 46 positions shown; findings below may reference images not displayed]

FINDINGS: Lower chest: Bases are clear.

Hepatobiliary: No focal liver abnormality is seen. No gallstones,
gallbladder wall thickening, or biliary dilatation.

Pancreas: No ductal dilatation or inflammation.

Spleen: Normal in size without focal abnormality. Small splenule
anteriorly.

Adrenals/Urinary Tract: Normal adrenal glands. Obstructing 7 x 6 mm
stone at the right ureterovesicular junction with moderate
hydroureteronephrosis. Mild right perinephric edema. No additional
nonobstructing stones in either kidney. No left hydronephrosis.
Urinary bladder is near completely empty. No bladder wall
thickening.

Stomach/Bowel: Stomach physiologically distended. No bowel wall
thickening, inflammatory change, or obstruction. Normal appendix.

Vascular/Lymphatic: Normal caliber abdominal aorta. No enlarged
lymph nodes in the abdomen or pelvis.

Reproductive: Uterus and bilateral adnexa are unremarkable.

Other: Tiny fat containing umbilical hernia. No free air, free
fluid, or intra-abdominal fluid collection.

Musculoskeletal: Transitional lumbosacral anatomy. There are no
acute or suspicious osseous abnormalities.
IMPRESSION: Obstructing 7 x 6 mm stone at the right ureterovesicular junction
with moderate hydronephrosis.

## 2019-10-21 ENCOUNTER — Ambulatory Visit: Payer: Self-pay | Attending: Internal Medicine

## 2019-10-21 DIAGNOSIS — Z23 Encounter for immunization: Secondary | ICD-10-CM

## 2019-10-21 NOTE — Progress Notes (Signed)
   Covid-19 Vaccination Clinic  Name:  Jaedin Trumbo    MRN: [id number] DOB: [birth year]  10/21/2019  Ms. Councilman was observed post Covid-19 immunization for 15 minutes without incident. She was provided with Vaccine Information Sheet and instruction to access the V-Safe system.   Ms. Brach was instructed to call 911 with any severe reactions post vaccine: Marland Kitchen Difficulty breathing  . Swelling of face and throat  . A fast heartbeat  . A bad rash all over body  . Dizziness and weakness   Immunizations Administered    Name Date Dose VIS Date Route   Pfizer COVID-19 Vaccine 10/21/2019  4:02 PM 0.3 mL 07/07/2019 Intramuscular   Manufacturer: [employer], [employer]   Lot: [id number]   NDC: [address]-4

## 2019-11-14 ENCOUNTER — Ambulatory Visit: Payer: Self-pay | Attending: Internal Medicine

## 2019-11-14 DIAGNOSIS — Z23 Encounter for immunization: Secondary | ICD-10-CM

## 2019-11-14 NOTE — Progress Notes (Signed)
   Covid-19 Vaccination Clinic  Name:  Amy Moran    MRN: [id number] DOB: 12-Jan-1976  11/14/2019  Amy Moran was observed post Covid-19 immunization for 15 minutes without incident. She was provided with Vaccine Information Sheet and instruction to access the V-Safe system.   Amy Moran was instructed to call 911 with any severe reactions post vaccine: Marland Kitchen Difficulty breathing  . Swelling of face and throat  . A fast heartbeat  . A bad rash all over body  . Dizziness and weakness   Immunizations Administered    Name Date Dose VIS Date Route   Pfizer COVID-19 Vaccine 11/14/2019 11:42 AM 0.3 mL 09/20/2018 Intramuscular   Manufacturer: [employer], [employer]   Lot: [id number]   NDC: [address]-0
